# Patient Record
Sex: Male | Born: 1955 | Race: White | Hispanic: No | Marital: Single | Smoking: Never smoker
Health system: Southern US, Community
[De-identification: ages and names within clinical notes are randomized; demographics above are authoritative.]

## PROBLEM LIST (undated history)

## (undated) DIAGNOSIS — N189 Chronic kidney disease, unspecified: Secondary | ICD-10-CM

## (undated) DIAGNOSIS — T7840XA Allergy, unspecified, initial encounter: Secondary | ICD-10-CM

## (undated) DIAGNOSIS — F1910 Other psychoactive substance abuse, uncomplicated: Secondary | ICD-10-CM

## (undated) HISTORY — PX: KIDNEY STONE SURGERY: SHX686

## (undated) HISTORY — DX: Chronic kidney disease, unspecified: N18.9

## (undated) HISTORY — DX: Other psychoactive substance abuse, uncomplicated: F19.10

## (undated) HISTORY — DX: Allergy, unspecified, initial encounter: T78.40XA

---

## 2013-01-28 ENCOUNTER — Ambulatory Visit: Payer: Self-pay | Admitting: Family Medicine

## 2013-01-28 VITALS — BP 132/86 | HR 61 | Temp 98.5°F | Resp 18 | Ht 69.0 in | Wt 213.4 lb

## 2013-01-28 DIAGNOSIS — M109 Gout, unspecified: Secondary | ICD-10-CM

## 2013-01-28 DIAGNOSIS — E669 Obesity, unspecified: Secondary | ICD-10-CM

## 2013-01-28 DIAGNOSIS — Z79899 Other long term (current) drug therapy: Secondary | ICD-10-CM

## 2013-01-28 LAB — LIPID PANEL
Cholesterol: 196 mg/dL (ref 0–200)
HDL: 34 mg/dL — ABNORMAL LOW (ref >39)
Total CHOL/HDL Ratio: 5.8 Ratio
Triglycerides: 225 mg/dL — ABNORMAL HIGH (ref <150)

## 2013-01-28 LAB — PSA: PSA: 1.77 ng/mL (ref <=4.00)

## 2013-01-28 MED ORDER — ALLOPURINOL 100 MG PO TABS: 300.0000 mg | ORAL_TABLET | Freq: Every day | ORAL | Status: DC

## 2013-01-28 MED ORDER — VENLAFAXINE HCL ER 75 MG PO CP24: 75.0000 mg | ORAL_CAPSULE | Freq: Every day | ORAL | Status: DC

## 2013-01-28 NOTE — Patient Instructions (Signed)
Health Maintenance, Males A healthy lifestyle and preventative care can promote health and wellness.  Maintain regular health, dental, and eye exams.  Eat a healthy diet. Foods like vegetables, fruits, whole grains, low-fat dairy products, and lean protein foods contain the nutrients you need without too many calories. Decrease your intake of foods high in solid fats, added sugars, and salt. Get information about a proper diet from your caregiver, if necessary.  Regular physical exercise is one of the most important things you can do for your health. Most adults should get at least 150 minutes of moderate-intensity exercise (any activity that increases your heart rate and causes you to sweat) each week. In addition, most adults need muscle-strengthening exercises on 2 or more days a week.   Maintain a healthy weight. The body mass index (BMI) is a screening tool to identify possible weight problems. It provides an estimate of body fat based on height and weight. Your caregiver can help determine your BMI, and can help you achieve or maintain a healthy weight. For adults 20 years and older:  A BMI below 18.5 is considered underweight.  A BMI of 18.5 to 24.9 is normal.  A BMI of 25 to 29.9 is considered overweight.  A BMI of 30 and above is considered obese.  Maintain normal blood lipids and cholesterol by exercising and minimizing your intake of saturated fat. Eat a balanced diet with plenty of fruits and vegetables. Blood tests for lipids and cholesterol should begin at age 20 and be repeated every 5 years. If your lipid or cholesterol levels are high, you are over 50, or you are a high risk for heart disease, you may need your cholesterol levels checked more frequently.Ongoing high lipid and cholesterol levels should be treated with medicines, if diet and exercise are not effective.  If you smoke, find out from your caregiver how to quit. If you do not use tobacco, do not start.  If you  choose to drink alcohol, do not exceed 2 drinks per day. One drink is considered to be 12 ounces (355 mL) of beer, 5 ounces (148 mL) of wine, or 1.5 ounces (44 mL) of liquor.  Avoid use of street drugs. Do not share needles with anyone. Ask for help if you need support or instructions about stopping the use of drugs.  High blood pressure causes heart disease and increases the risk of stroke. Blood pressure should be checked at least every 1 to 2 years. Ongoing high blood pressure should be treated with medicines if weight loss and exercise are not effective.  If you are 45 to 56 years old, ask your caregiver if you should take aspirin to prevent heart disease.  Diabetes screening involves taking a blood sample to check your fasting blood sugar level. This should be done once every 3 years, after age 45, if you are within normal weight and without risk factors for diabetes. Testing should be considered at a younger age or be carried out more frequently if you are overweight and have at least 1 risk factor for diabetes.  Colorectal cancer can be detected and often prevented. Most routine colorectal cancer screening begins at the age of 50 and continues through age 75. However, your caregiver may recommend screening at an earlier age if you have risk factors for colon cancer. On a yearly basis, your caregiver may provide home test kits to check for hidden blood in the stool. Use of a small camera at the end of a tube,   to directly examine the colon (sigmoidoscopy or colonoscopy), can detect the earliest forms of colorectal cancer. Talk to your caregiver about this at age 50, when routine screening begins. Direct examination of the colon should be repeated every 5 to 10 years through age 75, unless early forms of pre-cancerous polyps or small growths are found.  Hepatitis C blood testing is recommended for all people born from 1945 through 1965 and any individual with known risks for hepatitis C.  Healthy  men should no longer receive prostate-specific antigen (PSA) blood tests as part of routine cancer screening. Consult with your caregiver about prostate cancer screening.  Testicular cancer screening is not recommended for adolescents or adult males who have no symptoms. Screening includes self-exam, caregiver exam, and other screening tests. Consult with your caregiver about any symptoms you have or any concerns you have about testicular cancer.  Practice safe sex. Use condoms and avoid high-risk sexual practices to reduce the spread of sexually transmitted infections (STIs).  Use sunscreen with a sun protection factor (SPF) of 30 or greater. Apply sunscreen liberally and repeatedly throughout the day. You should seek shade when your shadow is shorter than you. Protect yourself by wearing long sleeves, pants, a wide-brimmed hat, and sunglasses year round, whenever you are outdoors.  Notify your caregiver of new moles or changes in moles, especially if there is a change in shape or color. Also notify your caregiver if a mole is larger than the size of a pencil eraser.  A one-time screening for abdominal aortic aneurysm (AAA) and surgical repair of large AAAs by sound wave imaging (ultrasonography) is recommended for ages 65 to 75 years who are current or former smokers.  Stay current with your immunizations. Document Released: 05/02/2008 Document Revised: 01/27/2012 Document Reviewed: 04/01/2011 Exit[employer] Patient Information 2013 [employer].  

## 2013-01-28 NOTE — Progress Notes (Signed)
  Subjective:    Patient ID: Richard Dawson, male    DOB: 1956-06-06, 57 y.o.   MRN: [id number] Chief Complaint  Patient presents with  . Medication Refill    establish new dr   HPI On Effexor and allopurinol 100mg  once a day.  To prevent kidney stones and gout so always borderlines uric acid level since 57 yo and around 57 yo had sev kidney stones but no sig ones now since 1995.  Had severe gout flaire in L elbow - still with limited ROM.  Had gout flair qyr to q81mos.  [facility] in [place], [place] - last check up was at 57 yo.  Did have a colonoscopy at 57 yo - nml so recheck in 10 yrs.   Is fasting.  Did have a little cream in the coffee.  Occ aches and pains - occ back pains.  Past Medical History  Diagnosis Date  . Allergy   . Chronic kidney disease     kidney stones  . Substance abuse   Gout  Cousin who is 11 has an elarged prostate and heart attack.  Has lost 8-10 lbs in the past few mo through diet and exercise. Has an 56 yo -  And due to have a newborn in 6 mos  No current outpatient prescriptions on file prior to visit.   No current facility-administered medications on file prior to visit.   No Known Allergies  Review of Systems    BP 132/86  Pulse 61  Temp(Src) 98.5 F (36.9 C) (Oral)  Resp 18  Ht 5\' 9"  (1.753 m)  Wt 213 lb 6.4 oz (96.798 kg)  BMI 31.5 kg/m2  SpO2 97% Objective:   Physical Exam        Assessment & Plan:  Gout - Plan: allopurinol (ZYLOPRIM) 100 MG tablet  Encounter for long-term (current) use of other medications  Obesity (BMI 30.0-34.9) - Plan: POCT glucose (manual entry), PSA, Lipid panel, Uric acid  Meds ordered this encounter  Medications  . DISCONTD: venlafaxine (EFFEXOR) 75 MG tablet    Sig: Take 75 mg by mouth 2 (two) times daily.  Marland Kitchen venlafaxine XR (EFFEXOR XR) 75 MG 24 hr capsule    Sig: Take 1 capsule (75 mg total) by mouth daily.    Dispense:  90 capsule    Refill:  3  . allopurinol (ZYLOPRIM) 100 MG tablet    Sig: Take  3 tablets (300 mg total) by mouth daily.    Dispense:  90 tablet    Refill:  3

## 2013-02-07 ENCOUNTER — Other Ambulatory Visit: Payer: Self-pay | Admitting: Family Medicine

## 2013-02-07 DIAGNOSIS — Z8249 Family history of ischemic heart disease and other diseases of the circulatory system: Secondary | ICD-10-CM

## 2013-02-15 ENCOUNTER — Encounter: Payer: Self-pay | Admitting: *Deleted

## 2013-02-15 ENCOUNTER — Encounter: Payer: Self-pay | Admitting: Cardiology

## 2013-02-15 DIAGNOSIS — N189 Chronic kidney disease, unspecified: Secondary | ICD-10-CM | POA: Insufficient documentation

## 2013-02-15 DIAGNOSIS — T7840XA Allergy, unspecified, initial encounter: Secondary | ICD-10-CM | POA: Insufficient documentation

## 2013-02-15 DIAGNOSIS — F1910 Other psychoactive substance abuse, uncomplicated: Secondary | ICD-10-CM | POA: Insufficient documentation

## 2013-02-16 ENCOUNTER — Institutional Professional Consult (permissible substitution): Payer: Self-pay | Admitting: Cardiology

## 2013-03-22 ENCOUNTER — Institutional Professional Consult (permissible substitution): Payer: Self-pay | Admitting: Cardiology

## 2013-04-01 ENCOUNTER — Ambulatory Visit: Payer: Self-pay

## 2013-04-01 ENCOUNTER — Ambulatory Visit: Payer: Self-pay | Admitting: Emergency Medicine

## 2013-04-01 VITALS — BP 124/78 | HR 82 | Temp 98.0°F | Resp 17 | Ht 70.0 in | Wt 212.0 lb

## 2013-04-01 DIAGNOSIS — M545 Low back pain: Secondary | ICD-10-CM

## 2013-04-01 DIAGNOSIS — M5137 Other intervertebral disc degeneration, lumbosacral region: Secondary | ICD-10-CM

## 2013-04-01 MED ORDER — HYDROCODONE-ACETAMINOPHEN 5-325 MG PO TABS: 1.0000 | ORAL_TABLET | Freq: Four times a day (QID) | ORAL | Status: DC | PRN

## 2013-04-01 MED ORDER — MELOXICAM 15 MG PO TABS: 15.0000 mg | ORAL_TABLET | Freq: Every day | ORAL | Status: DC

## 2013-04-01 NOTE — Progress Notes (Signed)
  Subjective:    Patient ID: Richard Dawson, male    DOB: [birth year], 57 y.o.   MRN: [id number]  HPI Patient complains of pain with his low back from a fall skateboarding 2 months ago. Worse yesterday when he stepped off ladder after cleaning gutters, stepped backwards, was a couple rungs up the ladder, not on bottom rung. Has been treated for this in [place]. Was treated with medications, but states does not recall what he was given, states medication given was not helpful. A neighbor gave him some meds that were helpful. Has 43 week old newborn baby, and has son who is 18 years old. 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  Has pain with flexion of low back, pain with holding the baby. Has seen Dr Clelia Croft for his physical, has stress test scheduled for July 9th. He is self employed. When asked about substance abuse history, denies drug use in past 10 years. Denies any trouble with addiction to opiates. His past history was alcohol and cocaine.   Patient denies any radicular pain, denies any problems with his bowel and bladder function since this injury.  Review of Systems     Objective:   Physical Exam Healthy appearing 57 y.o male with tenderness over low back. Negative straight leg raise bilateral. Deep tendon reflexes are 2+. There is no definite motor weakness. He has limited extension flexion but normal lateral posting.  [facility] reading (PRIMARY) by  Dr. Cleta Alberts there is a disc at L4-L5 with questionable vacuum phenomenon please comment       Assessment & Plan:  Patient will be on Mobic 15 mg one every morning. He was given hydrocodone 5 mg as needed. He is referred of the airway is for consideration for decompression therapy for disc disease

## 2013-04-01 NOTE — Patient Instructions (Signed)
Back Pain, Adult Low back pain is very common. About 1 in 5 people have back pain.The cause of low back pain is rarely dangerous. The pain often gets better over time.About half of people with a sudden onset of back pain feel better in just 2 weeks. About 8 in 10 people feel better by 6 weeks.  CAUSES Some common causes of back pain include:  Strain of the muscles or ligaments supporting the spine.  Wear and tear (degeneration) of the spinal discs.  Arthritis.  Direct injury to the back. DIAGNOSIS Most of the time, the direct cause of low back pain is not known.However, back pain can be treated effectively even when the exact cause of the pain is unknown.Answering your caregiver's questions about your overall health and symptoms is one of the most accurate ways to make sure the cause of your pain is not dangerous. If your caregiver needs more information, he or she may order lab work or imaging tests (X-rays or MRIs).However, even if imaging tests show changes in your back, this usually does not require surgery. HOME CARE INSTRUCTIONS For many people, back pain returns.Since low back pain is rarely dangerous, it is often a condition that people can learn to manageon their own.   Remain active. It is stressful on the back to sit or stand in one place. Do not sit, drive, or stand in one place for more than 30 minutes at a time. Take short walks on level surfaces as soon as pain allows.Try to increase the length of time you walk each day.  Do not stay in bed.Resting more than 1 or 2 days can delay your recovery.  Do not avoid exercise or work.Your body is made to move.It is not dangerous to be active, even though your back may hurt.Your back will likely heal faster if you return to being active before your pain is gone.  Pay attention to your body when you bend and lift. Many people have less discomfortwhen lifting if they bend their knees, keep the load close to their bodies,and  avoid twisting. Often, the most comfortable positions are those that put less stress on your recovering back.  Find a comfortable position to sleep. Use a firm mattress and lie on your side with your knees slightly bent. If you lie on your back, put a pillow under your knees.  Only take over-the-counter or prescription medicines as directed by your caregiver. Over-the-counter medicines to reduce pain and inflammation are often the most helpful.Your caregiver may prescribe muscle relaxant drugs.These medicines help dull your pain so you can more quickly return to your normal activities and healthy exercise.  Put ice on the injured area.  Put ice in a plastic bag.  Place a towel between your skin and the bag.  Leave the ice on for 15 to 20 minutes, 3 to 4 times a day for the first 2 to 3 days. After that, ice and heat may be alternated to reduce pain and spasms.  Ask your caregiver about trying back exercises and gentle massage. This may be of some benefit.  Avoid feeling anxious or stressed.Stress increases muscle tension and can worsen back pain.It is important to recognize when you are anxious or stressed and learn ways to manage it.Exercise is a great option. SEEK MEDICAL CARE IF:  You have pain that is not relieved with rest or medicine.  You have pain that does not improve in 1 week.  You have new symptoms.  You are generally   not feeling well. SEEK IMMEDIATE MEDICAL CARE IF:   You have pain that radiates from your back into your legs.  You develop new bowel or bladder control problems.  You have unusual weakness or numbness in your arms or legs.  You develop nausea or vomiting.  You develop abdominal pain.  You feel faint. Document Released: 11/04/2005 Document Revised: 05/05/2012 Document Reviewed: 03/25/2011 Exit[employer] Patient Information 2013 [employer].  

## 2013-05-24 ENCOUNTER — Institutional Professional Consult (permissible substitution): Payer: Self-pay | Admitting: Cardiology

## 2013-06-28 ENCOUNTER — Encounter: Payer: Self-pay | Admitting: Cardiology

## 2013-08-24 ENCOUNTER — Other Ambulatory Visit: Payer: Self-pay | Admitting: Family Medicine

## 2013-09-23 ENCOUNTER — Other Ambulatory Visit: Payer: Self-pay

## 2013-10-05 ENCOUNTER — Other Ambulatory Visit: Payer: Self-pay | Admitting: Family Medicine

## 2013-11-04 ENCOUNTER — Other Ambulatory Visit: Payer: Self-pay | Admitting: Physician Assistant

## 2013-12-10 ENCOUNTER — Ambulatory Visit: Payer: Self-pay | Admitting: Emergency Medicine

## 2013-12-10 ENCOUNTER — Ambulatory Visit: Payer: Self-pay

## 2013-12-10 ENCOUNTER — Telehealth: Payer: Self-pay | Admitting: Emergency Medicine

## 2013-12-10 VITALS — BP 124/80 | HR 68 | Temp 97.7°F | Resp 18 | Ht 70.0 in | Wt 222.0 lb

## 2013-12-10 DIAGNOSIS — F32A Depression, unspecified: Secondary | ICD-10-CM

## 2013-12-10 DIAGNOSIS — E785 Hyperlipidemia, unspecified: Secondary | ICD-10-CM

## 2013-12-10 DIAGNOSIS — M109 Gout, unspecified: Secondary | ICD-10-CM

## 2013-12-10 DIAGNOSIS — M5137 Other intervertebral disc degeneration, lumbosacral region: Secondary | ICD-10-CM

## 2013-12-10 DIAGNOSIS — F3289 Other specified depressive episodes: Secondary | ICD-10-CM

## 2013-12-10 DIAGNOSIS — F329 Major depressive disorder, single episode, unspecified: Secondary | ICD-10-CM

## 2013-12-10 LAB — COMPREHENSIVE METABOLIC PANEL
ALK PHOS: 51 U/L (ref 39–117)
ALT: 21 U/L (ref 0–53)
AST: 20 U/L (ref 0–37)
Albumin: 4.5 g/dL (ref 3.5–5.2)
BILIRUBIN TOTAL: 0.4 mg/dL (ref 0.3–1.2)
BUN: 15 mg/dL (ref 6–23)
CO2: 24 mEq/L (ref 19–32)
CREATININE: 0.76 mg/dL (ref 0.50–1.35)
Calcium: 9.6 mg/dL (ref 8.4–10.5)
Chloride: 104 mEq/L (ref 96–112)
GLUCOSE: 91 mg/dL (ref 70–99)
Potassium: 4.7 mEq/L (ref 3.5–5.3)
SODIUM: 138 meq/L (ref 135–145)
TOTAL PROTEIN: 7.5 g/dL (ref 6.0–8.3)

## 2013-12-10 LAB — LIPID PANEL
CHOL/HDL RATIO: 5.6 ratio
Cholesterol: 196 mg/dL (ref 0–200)
HDL: 35 mg/dL — ABNORMAL LOW (ref >39)
LDL CALC: 98 mg/dL (ref 0–99)
TRIGLYCERIDES: 316 mg/dL — AB (ref <150)
VLDL: 63 mg/dL — AB (ref 0–40)

## 2013-12-10 LAB — POCT CBC
Granulocyte percent: 50.7 %G (ref 37–80)
HCT, POC: 47.8 % (ref 43.5–53.7)
Hemoglobin: 14.9 g/dL (ref 14.1–18.1)
LYMPH, POC: 2.7 (ref 0.6–3.4)
MCH: 26.4 pg — AB (ref 27–31.2)
MCHC: 31.2 g/dL — AB (ref 31.8–35.4)
MCV: 84.8 fL (ref 80–97)
MID (CBC): 0.6 (ref 0–0.9)
MPV: 8.4 fL (ref 0–99.8)
POC GRANULOCYTE: 3.3 (ref 2–6.9)
POC LYMPH %: 40.3 % (ref 10–50)
POC MID %: 9 % (ref 0–12)
Platelet Count, POC: 318 10*3/uL (ref 142–424)
RBC: 5.64 M/uL (ref 4.69–6.13)
RDW, POC: 14.1 %
WBC: 6.6 10*3/uL (ref 4.6–10.2)

## 2013-12-10 LAB — URIC ACID: Uric Acid, Serum: 7.8 mg/dL (ref 4.0–7.8)

## 2013-12-10 MED ORDER — MELOXICAM 15 MG PO TABS: 15.0000 mg | ORAL_TABLET | Freq: Every day | ORAL | Status: DC

## 2013-12-10 MED ORDER — VENLAFAXINE HCL ER 75 MG PO CP24: ORAL_CAPSULE | ORAL | Status: DC

## 2013-12-10 MED ORDER — ALLOPURINOL 300 MG PO TABS: ORAL_TABLET | ORAL | Status: DC

## 2013-12-10 NOTE — Telephone Encounter (Signed)
Call patient and let him know the soft tissue density is nonspecific. I would advise a repeat x-ray in 3-6 months to be sure the lesion is not increasing in size

## 2013-12-10 NOTE — Progress Notes (Addendum)
Subjective:   Patient ID: Richard Dawson, male    DOB: Jul 24, 1956, 58 y.o.   MRN: [id number]  This chart was scribed for Lesle Chris, MD by Ardelia Mems, Scribe. This patient was seen in room 8 and the patient's care was started at 11:22 AM.  Chief Complaint  Patient presents with  . rx refills    effexor but would like to chang to wellburtin  . Tendonitis  . thumb pian  . Knee Pain    left mostly   . right foot pain  . itchy eyes    x1 mth    HPI  HPI Comments: Richard Dawson is a 58 y.o. male who presents to [facility] requesting medicaton refills. He states that he was last seen here about a year ago. He states that he has been out of his prescribed allopurinol (100 mg, 3x/day) for the past 3 weeks. He also states that he is out of his prescribed 75 mg Effexor, but that he would like to consider either a dosage increase or a change to Wellbutrin instead. He reports that he takes Effexor to "slow himself down". He also states that he has been having intermittent, aching right elbow pain for over a month. He states that this elbow pain is only present with palpation and exertion and not while at rest. He suspects from online research that his elbow pain could be tendonitis, and he does not believe this is due to his history of gout. He also reports he has been having right thumb pain that is worsened with gripping objects. He states that he has not had this season's flu vaccine, and that he has never had a flu vaccine, and he is unsure if he wants this today.   Review of Systems  Musculoskeletal: Arthralgias: right elbow.       Right thumb pain      BP 124/80  Pulse 68  Temp(Src) 97.7 F (36.5 C) (Oral)  Resp 18  Ht 5\' 10"  (1.778 m)  Wt 222 lb (100.699 kg)  BMI 31.85 kg/m2  SpO2 96% Objective:   Physical Exam CONSTITUTIONAL: Well developed/well nourished HEAD: Normocephalic/atraumatic EYES: EOMI/PERRL ENMT: Mucous membranes moist NECK: supple no meningeal  signs SPINE:entire spine nontender CV: S1/S2 noted, no murmurs/rubs/gallops noted LUNGS: Lungs are clear to auscultation bilaterally, no apparent distress ABDOMEN: soft, nontender, no rebound or guarding GU:no cva tenderness NEURO: Pt is awake/alert, moves all extremitiesx4 EXTREMITIES: pulses normal, full ROM SKIN: warm, color normal PSYCH: no abnormalities of mood noted [facility] reading (PRIMARY) by  Dr. Cleta Alberts examination of the thumb reveals arthritic changes at the base of the first metacarpal. Examination of the elbow reveals minimal bony changes there is a half centimeter by half centimeter soft tissue density seen to Meds ordered this encounter  Medications  . venlafaxine XR (EFFEXOR XR) 75 MG 24 hr capsule    Sig: Take 2 tablets daily for a total dose of the 150 mg a day    Dispense:  60 capsule    Refill:  5  . allopurinol (ZYLOPRIM) 300 MG tablet    Sig: Take one tablet daily    Dispense:  30 tablet    Refill:  11  . meloxicam (MOBIC) 15 MG tablet    Sig: Take 1 tablet (15 mg total) by mouth daily.    Dispense:  30 tablet    Refill:  1        Assessment & Plan:  Will increase the  dose of his Effexor continue his allopurinol 300 mg one a day labs were checked today. I also refilled his Mobic today we'll see what the radiologist says about soft tissue density on his elbow film.  I personally performed the services described in this documentation, which was scribed in my presence. The recorded information has been reviewed and is accurate.

## 2013-12-13 NOTE — Telephone Encounter (Signed)
Spoke with pt and informed him of results. He will do follow up xray in 3-6 months.

## 2014-02-16 ENCOUNTER — Other Ambulatory Visit: Payer: Self-pay | Admitting: Emergency Medicine

## 2014-03-30 ENCOUNTER — Other Ambulatory Visit: Payer: Self-pay

## 2014-03-30 MED ORDER — VENLAFAXINE HCL ER 150 MG PO CP24: 150.0000 mg | ORAL_CAPSULE | Freq: Every day | ORAL | Status: DC

## 2014-06-06 ENCOUNTER — Other Ambulatory Visit: Payer: Self-pay | Admitting: Emergency Medicine

## 2014-07-22 ENCOUNTER — Other Ambulatory Visit: Payer: Self-pay | Admitting: Physician Assistant

## 2015-04-05 ENCOUNTER — Ambulatory Visit (INDEPENDENT_AMBULATORY_CARE_PROVIDER_SITE_OTHER): Payer: Self-pay | Admitting: Physician Assistant

## 2015-04-05 VITALS — BP 144/80 | HR 83 | Temp 98.1°F | Resp 18 | Ht 70.25 in | Wt 218.0 lb

## 2015-04-05 DIAGNOSIS — F411 Generalized anxiety disorder: Secondary | ICD-10-CM

## 2015-04-05 DIAGNOSIS — M778 Other enthesopathies, not elsewhere classified: Secondary | ICD-10-CM

## 2015-04-05 MED ORDER — DICLOFENAC SODIUM 1 % TD GEL: 2.0000 g | Freq: Four times a day (QID) | TRANSDERMAL | Status: DC

## 2015-04-05 MED ORDER — DESVENLAFAXINE ER 50 MG PO TB24: 50.0000 mg | ORAL_TABLET | Freq: Every day | ORAL | Status: DC

## 2015-04-05 NOTE — Progress Notes (Signed)
   Subjective:    Patient ID: Richard Dawson, male    DOB: 02/14/1956, 59 y.o.   MRN: [id number]  HPI Patient presents to discuss changing Wellbutrin and Mobic.   Was recently changed from Venlafaxine 150 mg to Wellbutrin 3 weeks ago to treat his anxiety and has had unfavorable side effects of HA, nausea, and feeling terrible. In addition he is does not feel like medication takes the edge/impulsiveness/anxiety away. Feels "ramped up" most days, but feels like his day to day activities illicit these feelings. For example, has an 58 year old and 58 year old son, that he has raised his voice at more often since switching medication. Was taking Venlafaxine for 2 years, but feels he got used to medication. Has also tried Zoloft and Lexapro in the past which both worked for a time. Denies sleep disturbance, SI/HI, or depressed mood.   Has been taking Mobic for several years for should, elbow, and wrist pain. 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 He is also concerned about long term use of Mobic.   NKDA.   Review of Systems  Constitutional: Negative.   Gastrointestinal: Positive for nausea (not currently).  Neurological: Positive for headaches (not currently). Negative for dizziness, weakness, light-headedness and numbness.  Psychiatric/Behavioral: Positive for agitation. Negative for suicidal ideas, behavioral problems, sleep disturbance, self-injury, dysphoric mood and decreased concentration. The patient is nervous/anxious. The patient is not hyperactive.        Objective:   Physical Exam  Constitutional: He is oriented to person, place, and time. He appears well-developed and well-nourished. No distress.  Blood pressure 144/80, pulse 83, temperature 98.1 F (36.7 C), temperature source Oral, resp. rate 18, height 5' 10.25" (1.784 m), weight 218 lb (98.884 kg), SpO2 97 %.   HENT:  Head: Normocephalic and atraumatic.  Right Ear:  External ear normal.  Left Ear: External ear normal.  Eyes: Conjunctivae are normal. Right eye exhibits no discharge. Left eye exhibits no discharge.  Pulmonary/Chest: Effort normal.  Neurological: He is alert and oriented to person, place, and time.  Skin: He is not diaphoretic.  Psychiatric: He has a normal mood and affect. His speech is normal and behavior is normal. Judgment and thought content normal. Cognition and memory are normal.      Assessment & Plan:  1. Anxiety state RTC in 4-6 weeks for re-evaluation. Med side effects discussed.  - Desvenlafaxine ER (PRISTIQ) 50 MG TB24; Take 1 tablet (50 mg total) by mouth daily.  Dispense: 30 tablet; Refill: 1  2. Tendonitis of wrist, right - diclofenac sodium (VOLTAREN) 1 % GEL; Apply 2 g topically 4 (four) times daily.  Dispense: 1 Tube; Refill: 1   Seerat Peaden PA-C  [facility] 04/05/2015 1:42 PM

## 2015-04-06 NOTE — Progress Notes (Signed)
  Medical screening examination/treatment/procedure(s) were performed by non-physician practitioner and as supervising physician I was immediately available for consultation/collaboration.     

## 2015-06-16 IMAGING — CR DG FINGER THUMB 2+V*R*
1 series · 1 of 1 positions shown · non-contrast
Comparison: None.

CLINICAL DATA: Gout

EXAM:
RIGHT THUMB 2+V

[PA]
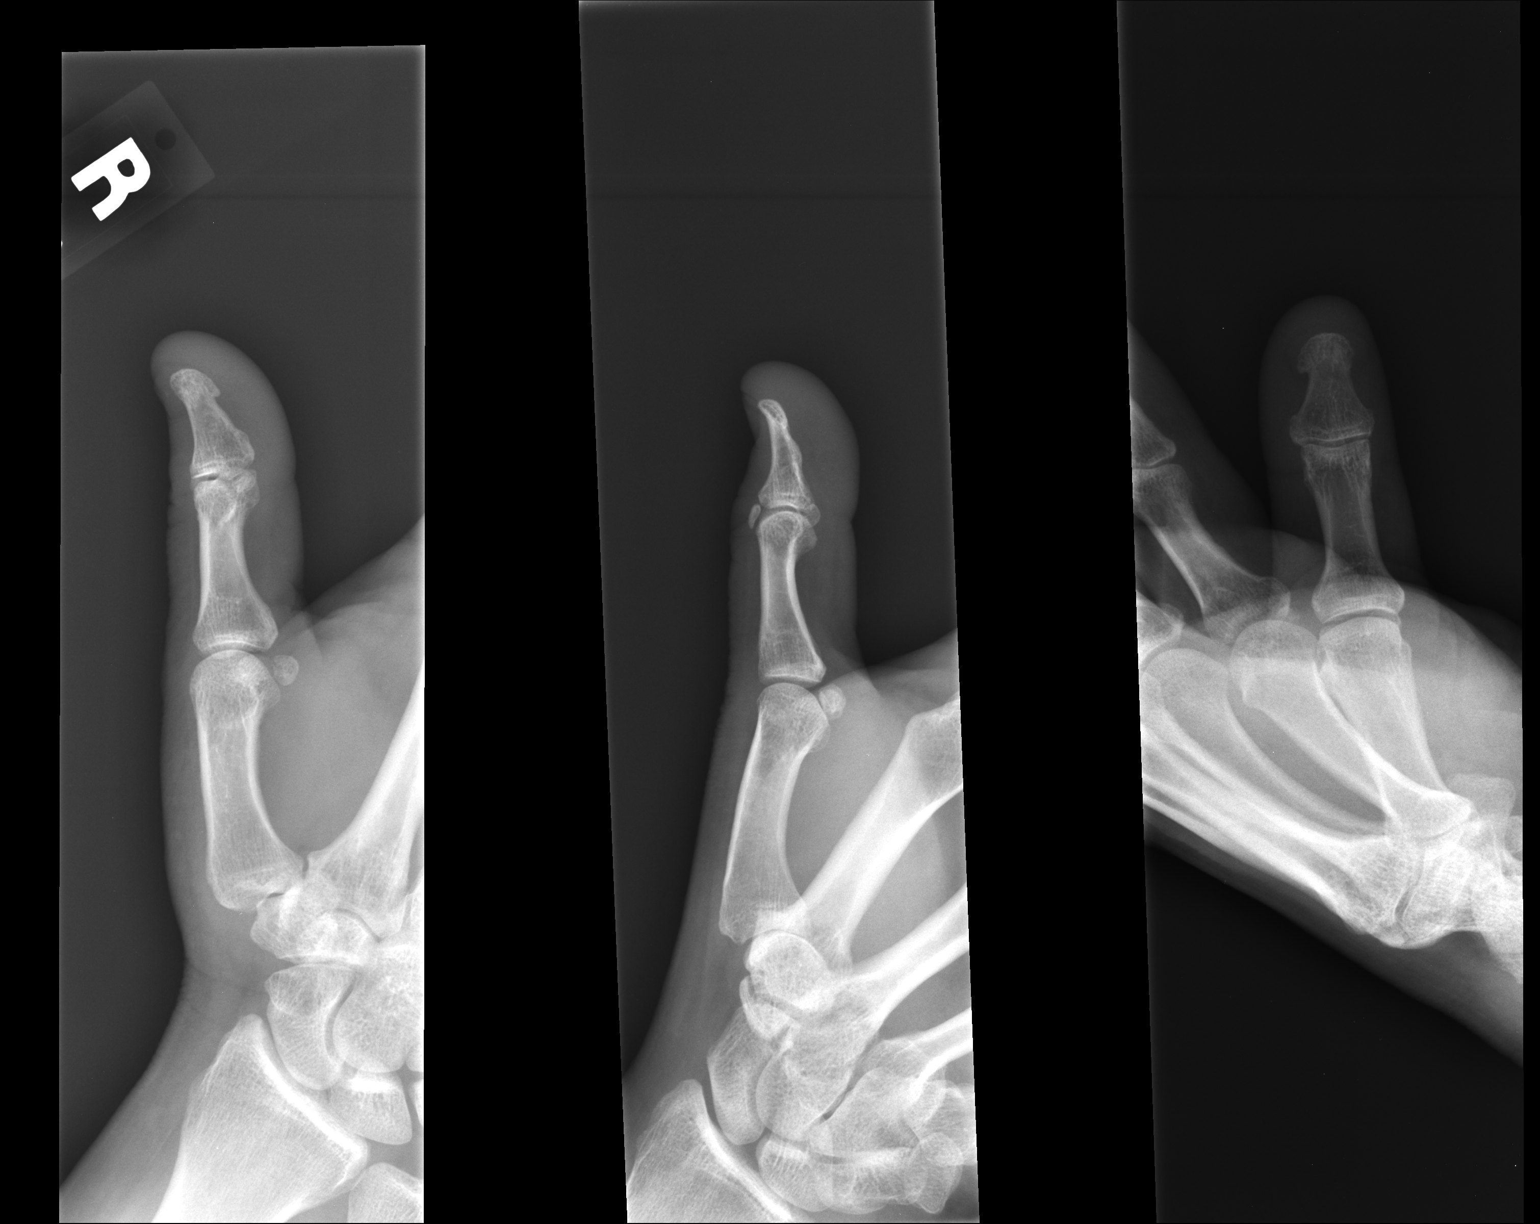

[1 of 1 positions shown; findings below may reference images not displayed]

FINDINGS: Three views of right thumb submitted. No acute fracture or
subluxation. Mild degenerative changes first carpometacarpal joint.
IMPRESSION: No acute fracture or subluxation. Mild degenerative changes first
carpometacarpal joint.

## 2015-06-16 IMAGING — CR DG ELBOW 2V*R*
2 series · 2 of 2 positions shown · non-contrast
Comparison: None.

CLINICAL DATA: Right elbow pain.

EXAM:
RIGHT ELBOW - 2 VIEW

[AP]
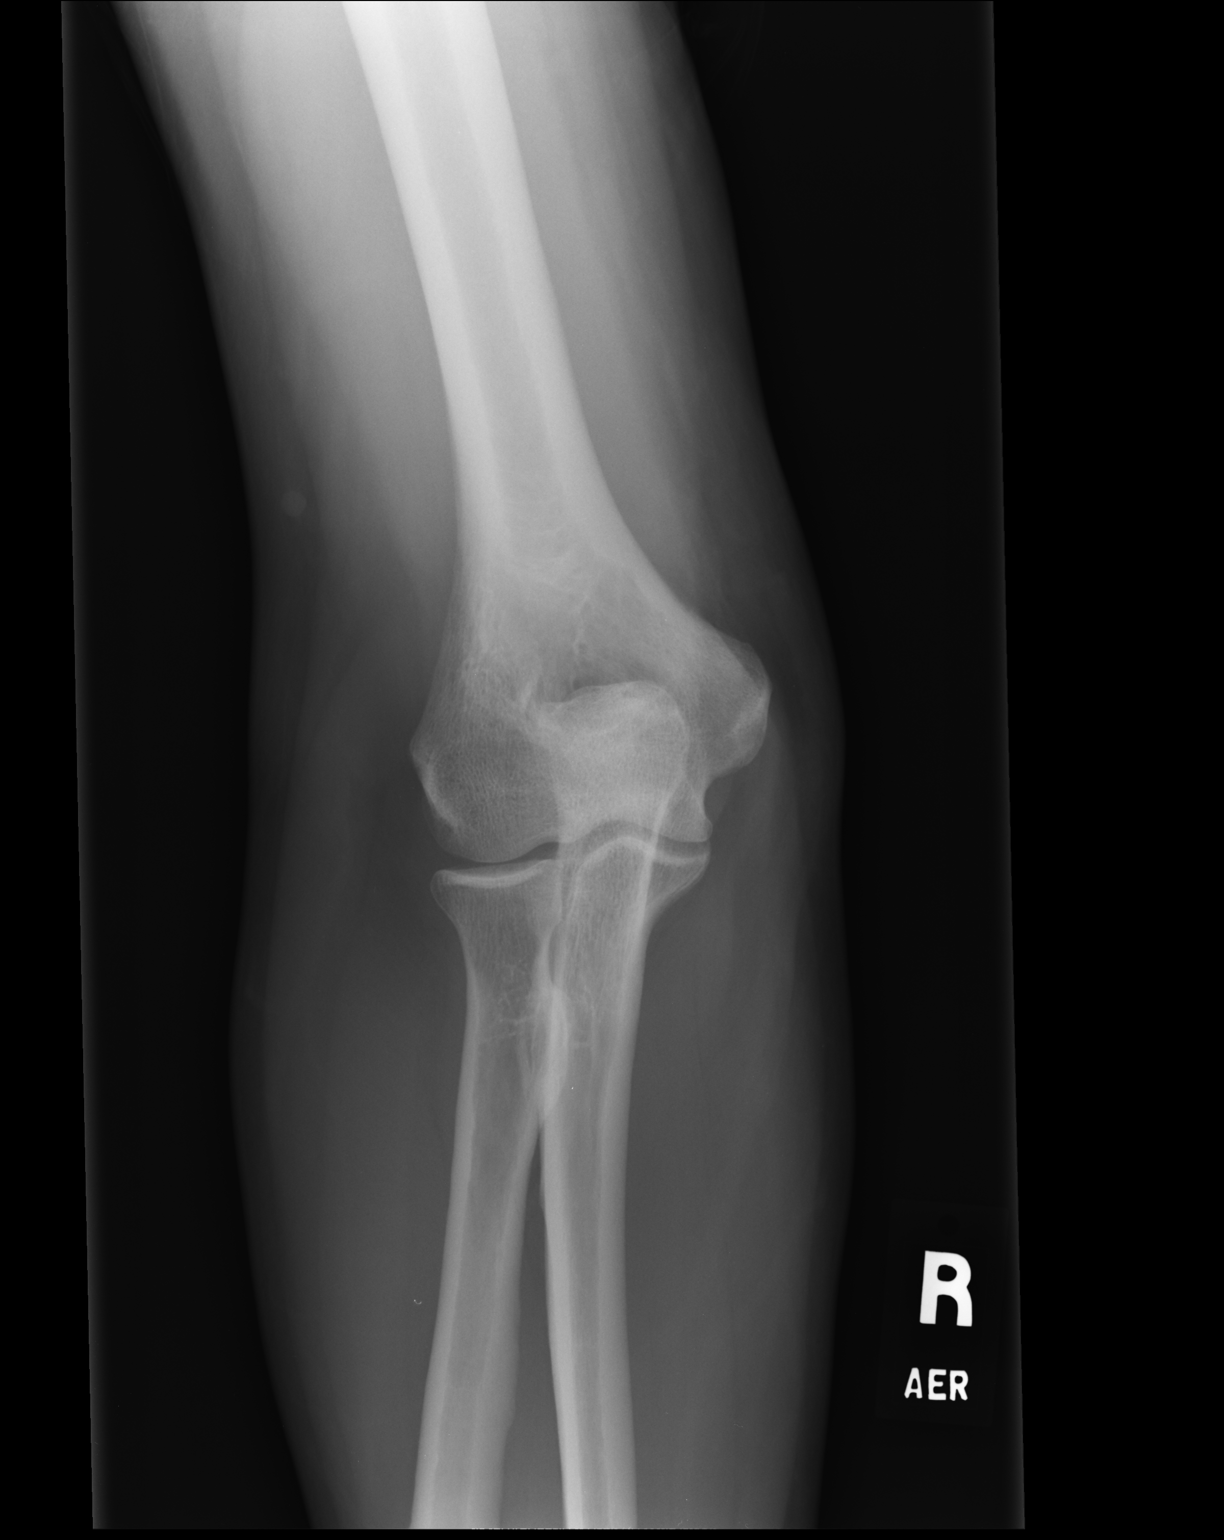

[lateral]
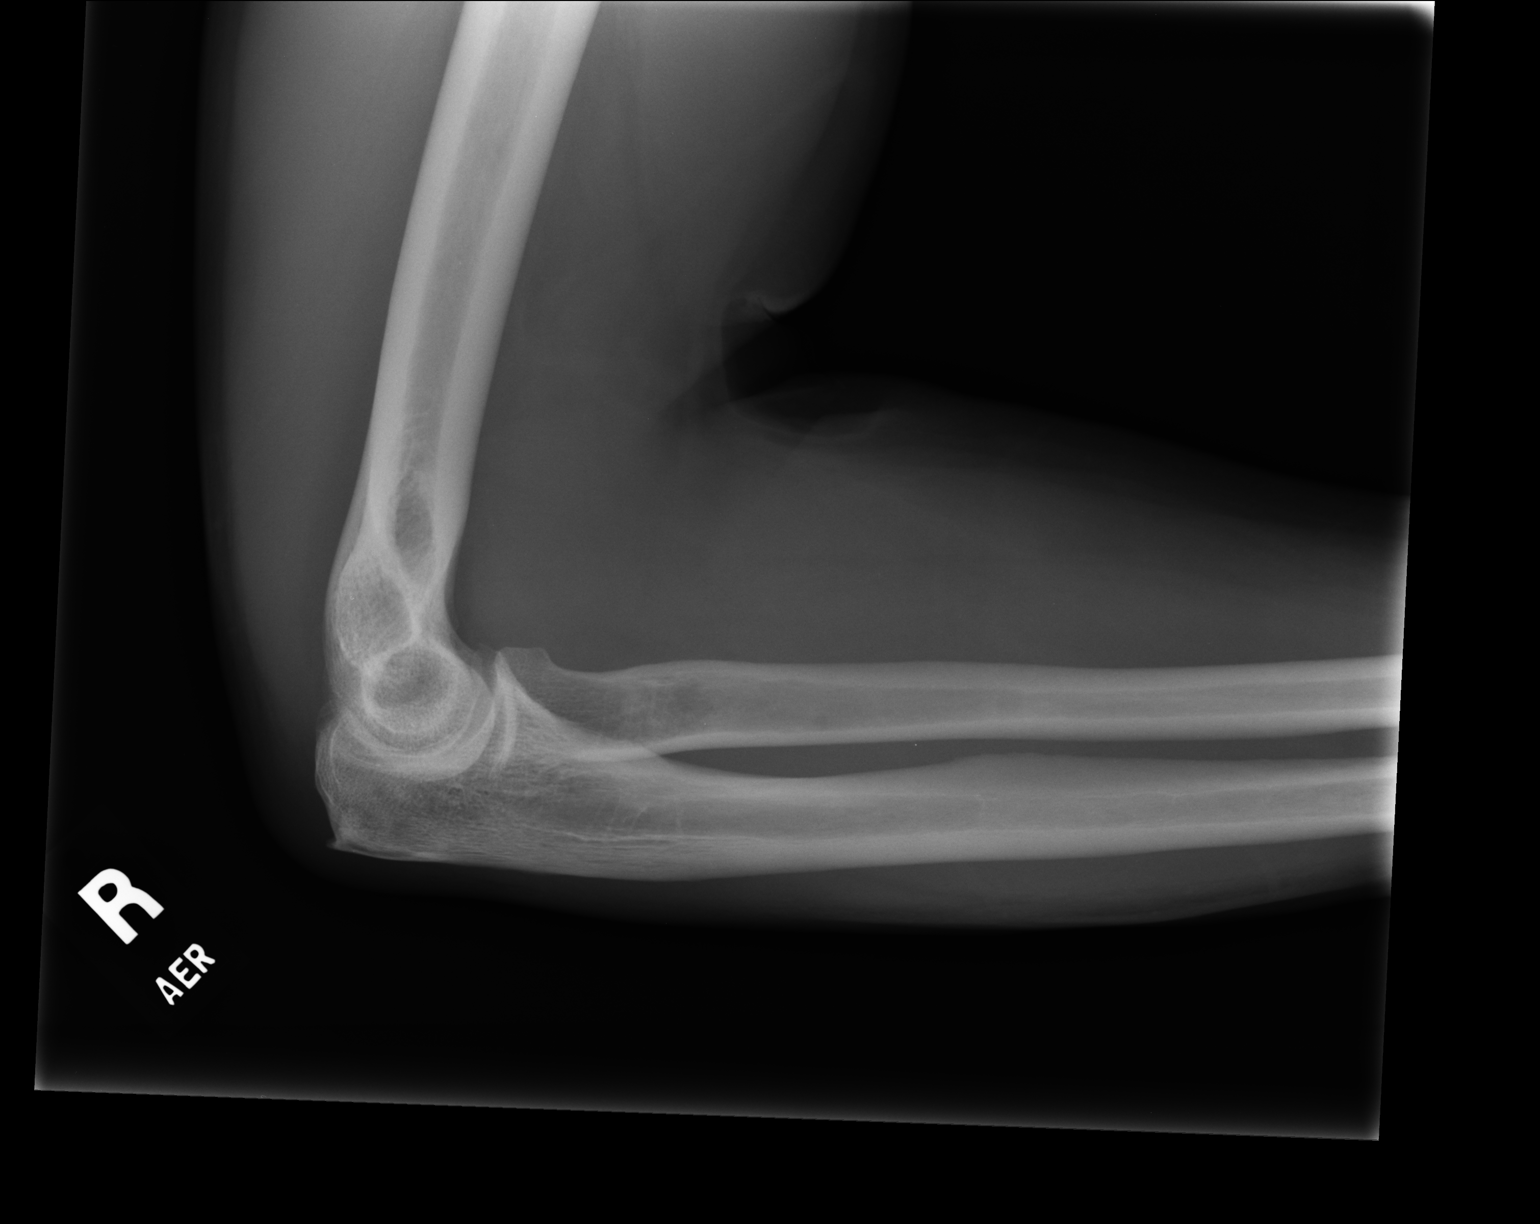

[2 of 2 positions shown; findings below may reference images not displayed]

FINDINGS: Two views of the right elbow were obtained. The elbow is located
without fracture. No evidence for a joint effusion. There is a round
5 mm density in the region of the distal upper arm soft tissues.
This finding is nonspecific.
IMPRESSION: No acute bone abnormality.

## 2015-06-25 ENCOUNTER — Ambulatory Visit (INDEPENDENT_AMBULATORY_CARE_PROVIDER_SITE_OTHER): Payer: Self-pay | Admitting: Physician Assistant

## 2015-06-25 ENCOUNTER — Other Ambulatory Visit: Payer: Self-pay | Admitting: Physician Assistant

## 2015-06-25 VITALS — BP 128/76 | HR 72 | Temp 98.2°F | Resp 18 | Ht 69.5 in | Wt 220.8 lb

## 2015-06-25 DIAGNOSIS — Z8659 Personal history of other mental and behavioral disorders: Secondary | ICD-10-CM

## 2015-06-25 MED ORDER — CITALOPRAM HYDROBROMIDE 40 MG PO TABS: 40.0000 mg | ORAL_TABLET | Freq: Every day | ORAL | Status: DC

## 2015-06-25 NOTE — Progress Notes (Signed)
   06/25/2015 at 4:36 PM  Richard Dawson / DOB: [birth year] / MRN: [id number]  The patient has Allergy; Chronic kidney disease; and Substance abuse on his problem list.  SUBJECTIVE  Chief complaint: Medication Problem   Patient here to go back to a medication that has worked well for him in the past.  Reports he has been taking Wellbutrin for 2 months now and is easy to anger and is suffering from anxiety.  He can not remember why he went of the Celexa, however he reports his anxiousness was at its lowest point while taking this.  His girlfriend also has been pushing him to go back to this medication as well.  His last dose of Wellbutrin was about 6 weeks ago.    He  has a past medical history of Allergy; Chronic kidney disease; and Substance abuse.    Medications reviewed and updated by myself where necessary, and exist elsewhere in the encounter.   Richard Dawson has No Known Allergies. He  reports that he has never smoked. He has never used smokeless tobacco. He reports that he does not drink alcohol or use illicit drugs. He  reports that he currently engages in sexual activity. He reports using the following method of birth control/protection: None. The patient  has past surgical history that includes Kidney stone surgery.  His family history includes Cancer in his father; Hypertension in his mother.  Review of Systems  Constitutional: Negative for fever and chills.  Skin: Negative for itching and rash.  Neurological: Negative for headaches.    OBJECTIVE  His  height is 5' 9.5" (1.765 m) and weight is 220 lb 12.8 oz (100.154 kg). His oral temperature is 98.2 F (36.8 C). His blood pressure is 128/76 and his pulse is 72. His respiration is 18 and oxygen saturation is 97%.  The patient's body mass index is 32.15 kg/(m^2).  Physical Exam  Vitals reviewed. Constitutional: He is oriented to person, place, and time.  Cardiovascular: Normal rate.   Respiratory: Effort normal.  Musculoskeletal:  Normal range of motion.  Neurological: He is alert and oriented to person, place, and time.  Skin: Skin is warm and dry.    No results found for this or any previous visit (from the past 24 hour(s)).  ASSESSMENT & PLAN  Richard Dawson was seen today for medication problem.  Diagnoses and all orders for this visit:  History of anxiety disorder: Patient prescribed Celexa at 40 mg in the past with good results.  Advised he titrate the dose up over two weeks.  He is to RTC with any difficulty. Advised that we see him back in 3 months if his medication is not working to consider a different serotonergic agent.  Orders: -     citalopram (CELEXA) 40 MG tablet; Take 1 tablet (40 mg total) by mouth daily. Take 20 mg daily for the first week, and then 40 after.   The patient was advised to call or come back to clinic if he does not see an improvement in symptoms, or worsens with the above plan.   Deliah Boston, MHS, PA-C [facility] 06/25/2015 4:36 PM

## 2015-09-07 ENCOUNTER — Ambulatory Visit (INDEPENDENT_AMBULATORY_CARE_PROVIDER_SITE_OTHER): Payer: Self-pay | Admitting: Physician Assistant

## 2015-09-07 ENCOUNTER — Other Ambulatory Visit: Payer: Self-pay | Admitting: Physician Assistant

## 2015-09-07 VITALS — BP 130/70 | HR 69 | Temp 98.0°F | Resp 20 | Ht 70.0 in | Wt 215.0 lb

## 2015-09-07 DIAGNOSIS — N529 Male erectile dysfunction, unspecified: Secondary | ICD-10-CM

## 2015-09-07 DIAGNOSIS — M25562 Pain in left knee: Secondary | ICD-10-CM

## 2015-09-07 DIAGNOSIS — Z8659 Personal history of other mental and behavioral disorders: Secondary | ICD-10-CM

## 2015-09-07 DIAGNOSIS — Z8739 Personal history of other diseases of the musculoskeletal system and connective tissue: Secondary | ICD-10-CM

## 2015-09-07 DIAGNOSIS — Z8639 Personal history of other endocrine, nutritional and metabolic disease: Secondary | ICD-10-CM

## 2015-09-07 MED ORDER — SILDENAFIL CITRATE 100 MG PO TABS: 50.0000 mg | ORAL_TABLET | Freq: Every day | ORAL | Status: AC | PRN

## 2015-09-07 MED ORDER — MELOXICAM 15 MG PO TABS: 15.0000 mg | ORAL_TABLET | Freq: Every day | ORAL | Status: DC

## 2015-09-07 MED ORDER — ALLOPURINOL 300 MG PO TABS: ORAL_TABLET | ORAL | Status: DC

## 2015-09-07 MED ORDER — CITALOPRAM HYDROBROMIDE 40 MG PO TABS: 40.0000 mg | ORAL_TABLET | Freq: Every day | ORAL | Status: DC

## 2015-09-07 NOTE — Progress Notes (Signed)
09/08/2015 at 3:24 PM  Richard Dawson / DOB: 1956/05/11 / MRN: [id number]  The patient has Allergy and Substance abuse on his problem list.  SUBJECTIVE  Richard Dawson is a 59 y.o. male here for medication refills.  States that he has some left knee pain and write wrist pain that is being evaluated and managed by orthopedics in [place].  He has a follow up in place in the next month but would like refill of pain medication tonight.  Reports he was given Lortabs and he would like some more of those.  He is also given Meloxicam which has worked well for him.    Has a history of anxiety and is managed by me with Citalopram.  He does not complain of any anxious feeling since starting the Citalopram.  Has a history of gout diagnosed by Dr. Cleta Alberts in 2015 and was advised to stay on gout prophylaxis.      He denies a history of heart disease and does not take nitroglycerin.  He reports difficulty with achieving an erection and has difficulty with intercourse due to this.  Reports he has had this problem for the last three years and states that Viagra helps and denies presyncope, palpitations and diaphoresis.    He  has a past medical history of Allergy; Chronic kidney disease; and Substance abuse.    Medications reviewed and updated by myself where necessary, and exist elsewhere in the encounter.   [name] has No Known Allergies. He  reports that he has never smoked. He has never used smokeless tobacco. He reports that he does not drink alcohol or use illicit drugs. He  reports that he currently engages in sexual activity. He reports using the following method of birth control/protection: None. The patient  has past surgical history that includes Kidney stone surgery.  His family history includes Cancer in his father; Hypertension in his mother.  Review of Systems  Constitutional: Negative for fever and chills.  Respiratory: Negative for shortness of breath.   Cardiovascular: Negative for chest  pain.  Gastrointestinal: Negative for nausea and abdominal pain.  Genitourinary: Negative.   Musculoskeletal: Positive for joint pain.  Skin: Negative for rash.  Neurological: Negative for dizziness and headaches.    OBJECTIVE  His  height is  (1.778 m) and weight is 215 lb (97.523 kg). His temperature is 98 F (36.7 C). His blood pressure is 130/70 and his pulse is 69. His respiration is 20 and oxygen saturation is 97%.  The patient's body mass index is 30.85 kg/(m^2).  Physical Exam  Vitals reviewed. Constitutional: He is oriented to person, place, and time. He appears well-developed. No distress.  Eyes: EOM are normal. Pupils are equal, round, and reactive to light. No scleral icterus.  Neck: Normal range of motion.  Cardiovascular: Normal rate.   Respiratory: Effort normal.  GI: He exhibits no distension.  Musculoskeletal: Normal range of motion.  Neurological: He is alert and oriented to person, place, and time. No cranial nerve deficit.  Skin: Skin is warm and dry. No rash noted. He is not diaphoretic.  Psychiatric: He has a normal mood and affect.    No results found for this or any previous visit (from the past 24 hour(s)).  ASSESSMENT & PLAN  Amari was seen today for medication refill. Patient requesting opioids for sporadic left knee and right wrist pain.  Advised against this. Opting for Meloxicam and patient amenable.  Medications refills per patient's request.  Patient without  history of CAD and does not take Nitrates.  Will write for Viagra.    Diagnoses and all orders for this visit:  History of anxiety disorder -     citalopram (CELEXA) 40 MG tablet; Take 1 tablet (40 mg total) by mouth daily. Take 20 mg daily for the first week, and then 40 after.  Left knee pain -     meloxicam (MOBIC) 15 MG tablet; Take 1 tablet (15 mg total) by mouth daily.  History of gout -     allopurinol (ZYLOPRIM) 300 MG tablet; Take one tablet daily  Erectile dysfunction,  unspecified erectile dysfunction type -     sildenafil (VIAGRA) 100 MG tablet; Take 0.5-1 tablets (50-100 mg total) by mouth daily as needed for erectile dysfunction.   The patient was advised to call or come back to clinic if he does not see an improvement in symptoms, or worsens with the above plan.   Deliah BostonMichael Fredrika Canby, MHS, PA-C [facility] 09/08/2015 3:24 PM

## 2015-09-09 NOTE — Progress Notes (Signed)
  Medical screening examination/treatment/procedure(s) were performed by non-physician practitioner and as supervising physician I was immediately available for consultation/collaboration.     

## 2016-01-20 ENCOUNTER — Other Ambulatory Visit: Payer: Self-pay | Admitting: Physician Assistant

## 2016-04-13 ENCOUNTER — Other Ambulatory Visit: Payer: Self-pay | Admitting: Physician Assistant

## 2016-07-25 ENCOUNTER — Ambulatory Visit (INDEPENDENT_AMBULATORY_CARE_PROVIDER_SITE_OTHER): Payer: Self-pay | Admitting: Family Medicine

## 2016-07-25 VITALS — BP 134/86 | HR 75 | Temp 97.8°F | Resp 16 | Ht 69.0 in | Wt 219.0 lb

## 2016-07-25 DIAGNOSIS — M79644 Pain in right finger(s): Secondary | ICD-10-CM

## 2016-07-25 DIAGNOSIS — Z8659 Personal history of other mental and behavioral disorders: Secondary | ICD-10-CM

## 2016-07-25 DIAGNOSIS — M109 Gout, unspecified: Secondary | ICD-10-CM

## 2016-07-25 MED ORDER — HYDROCODONE-ACETAMINOPHEN 10-325 MG PO TABS: 1.0000 | ORAL_TABLET | Freq: Three times a day (TID) | ORAL | 0 refills | Status: DC | PRN

## 2016-07-25 MED ORDER — MELOXICAM 15 MG PO TABS: ORAL_TABLET | ORAL | 0 refills | Status: DC

## 2016-07-25 MED ORDER — COLCHICINE 0.6 MG PO TABS: 0.6000 mg | ORAL_TABLET | Freq: Every day | ORAL | 0 refills | Status: DC

## 2016-07-25 MED ORDER — HYDROCODONE-ACETAMINOPHEN 5-325 MG PO TABS: 1.0000 | ORAL_TABLET | Freq: Four times a day (QID) | ORAL | 0 refills | Status: DC | PRN

## 2016-07-25 MED ORDER — CITALOPRAM HYDROBROMIDE 40 MG PO TABS: 40.0000 mg | ORAL_TABLET | Freq: Every day | ORAL | 11 refills | Status: AC

## 2016-07-25 NOTE — Progress Notes (Signed)
Richard Dawson is a 60 y.o. male who presents to [facility] today for Gout attack:  1.  Gout attack:  Patient with history of recurrent gout. This most recent attack started several days ago. Has been having increasing pain in his left great toe. This is always where he has a gout attack. 2 years ago he was started on allopurinol and this is his first attack since starting allopurinol. Pain is sharp and stabbing. Worse with walking.  2.  Right thumb pain:  Present for several months.  Works Veterinary surgeonflipping houses/contracting/construction.  Also right handed.  Pain when moving thumb or wielding a hammer.  Also when using his phone.  Has not tried anything for relief.  Saw hand surgeon who recommended surgical repair.  He would like to try something else first.    3.  Chronic back pain:  Long-term issue for patient.  Has occasional narcotics when he has busy building season.  Late summer (now) is that season.  Hasn't needed a refill since this time last year.  Pain is moderate aching pain in low back after full day of work.  No paresthesias or radiation to legs.  No weakness or numbness.  NO bladder/bowel incontinence.    ROS as above.    PMH reviewed. Patient is a nonsmoker.   Past Medical History:  Diagnosis Date  . Allergy   . Chronic kidney disease    kidney stones  . Substance abuse    Past Surgical History:  Procedure Laterality Date  . KIDNEY STONE SURGERY      Medications reviewed. Current Outpatient Prescriptions  Medication Sig Dispense Refill  . allopurinol (ZYLOPRIM) 300 MG tablet Take one tablet daily 30 tablet 11  . citalopram (CELEXA) 40 MG tablet Take 1 tablet (40 mg total) by mouth daily. Take 20 mg daily for the first week, and then 40 after. 30 tablet 11  . meloxicam (MOBIC) 15 MG tablet TAKE 1 TABLET(15 MG) BY MOUTH DAILY 30 tablet 0  . sildenafil (VIAGRA) 100 MG tablet Take 0.5-1 tablets (50-100 mg total) by mouth daily as needed for erectile dysfunction.  5 tablet 11   No current facility-administered medications for this visit.      Physical Exam:  BP 134/86 (BP Location: Right Arm, Patient Position: Sitting, Cuff Size: Normal)   Pulse 75   Temp 97.8 F (36.6 C)   Resp 16   Ht 5\' 9"  (1.753 m)   Wt 219 lb (99.3 kg)   SpO2 96%   BMI 32.34 kg/m  Gen:  Alert, cooperative patient who appears stated age in no acute distress.  Vital signs reviewed. HEENT: EOMI,  MMM Back:  Normal skin, Spine with normal alignment and no deformity.  No tenderness to vertebral process palpation.  Paraspinous muscles are tender BL lumbar region without spasm.    Neuro:  Sensation and motor function 5/5 bilateral lower extremities.  Patellar and Achilles  DTR's +2 patellar BL.  He is able to walk on his heels and toes without difficulty.  Ext:  No LE edema MSK: Diminished extension and abduction of Right thumb secondary to pain.  TTP directly over extensor pollicus.   Feet:  Right foot WNL.  Left great toe with podagra.  Red and swollen.  Very tender to touch at 1st MCP joint.   Psych: Conversant and pleasant.  Not depressed, anxious, or manic appearing.  Linear and non-tangential thought process noted    Assessment and Plan:  1.  Gout: - current flare.   - treat with colchicine.  Norco will help with pain relief as well.  - check uric acid and restart allopurinol based on level.  May need to increase dosage.   2.  Right de Quervain's tenosynovitis:   - discussed various options. - he is most interested in sports medicine referral and possible injection - would like to hold off surgery as long as possible.  - On chronic mobic for low back pain.  Refilled this, which will help with this as well.  - checking creatinine.   3.  Back pain: - chronic.  - very infrequent opioid user - refill for norco for short term use to get him through end of summer - Mobic refill as well - continue exercises as he has been.   4.  Anxiety disorder: - states this is  well controlled on celexa.  No issues - celexa refilled today.

## 2016-07-25 NOTE — Patient Instructions (Addendum)
  Use the Norco for pain relief.   We are checking your uric acid levels.  We will adjust your allopurinol based on that level.    Use the colchicine for pain relief of your gout as well.   We'll let you know the lab results   It was good to see you today   IF you received an x-ray today, you will receive an invoice from [facility]. Please contact [facility] at [phone] with questions or concerns regarding your invoice.   IF you received labwork today, you will receive an invoice from [facility]. Please contact [facility] at [phone] with questions or concerns regarding your invoice.   Our billing staff will not be able to assist you with questions regarding bills from these companies.  You will be contacted with the lab results as soon as they are available. The fastest way to get your results is to activate your My Chart account. Instructions are located on the last page of this paperwork. If you have not heard from us regarding the results in 2 weeks, please contact this office.

## 2016-07-26 ENCOUNTER — Telehealth: Payer: Self-pay | Admitting: Family Medicine

## 2016-07-26 LAB — URIC ACID: URIC ACID, SERUM: 5.1 mg/dL (ref 4.0–8.0)

## 2016-07-26 LAB — CBC
HCT: 41.9 % (ref 38.5–50.0)
Hemoglobin: 14.3 g/dL (ref 13.2–17.1)
MCH: 27 pg (ref 27.0–33.0)
MCHC: 34.1 g/dL (ref 32.0–36.0)
MCV: 79.2 fL — AB (ref 80.0–100.0)
MPV: 9.5 fL (ref 7.5–12.5)
PLATELETS: 322 10*3/uL (ref 140–400)
RBC: 5.29 MIL/uL (ref 4.20–5.80)
RDW: 14.5 % (ref 11.0–15.0)
WBC: 7.1 10*3/uL (ref 3.8–10.8)

## 2016-07-26 LAB — COMPREHENSIVE METABOLIC PANEL
ALT: 17 U/L (ref 9–46)
AST: 19 U/L (ref 10–35)
Albumin: 4.3 g/dL (ref 3.6–5.1)
Alkaline Phosphatase: 51 U/L (ref 40–115)
BUN: 14 mg/dL (ref 7–25)
CHLORIDE: 104 mmol/L (ref 98–110)
CO2: 22 mmol/L (ref 20–31)
Calcium: 9.8 mg/dL (ref 8.6–10.3)
Creat: 0.92 mg/dL (ref 0.70–1.33)
Glucose, Bld: 85 mg/dL (ref 65–99)
POTASSIUM: 4.3 mmol/L (ref 3.5–5.3)
Sodium: 137 mmol/L (ref 135–146)
TOTAL PROTEIN: 7.3 g/dL (ref 6.1–8.1)
Total Bilirubin: 0.3 mg/dL (ref 0.2–1.2)

## 2016-07-26 NOTE — Telephone Encounter (Signed)
Called and discussed labs with patient. His uric acid was actually normal which is not uncommon to have acute gout attack. I do not see any reason to change his allopurinol since it has been 2 years without a gout attack. Otherwise his labs look good.  He will call to schedule appointment for cholesterol check sometime in the coming weeks. He would also like a physical exam at that point in the future as well.  Appreciative of call. He will await to hear from sports medicine

## 2016-08-06 ENCOUNTER — Encounter: Payer: Self-pay | Admitting: Family Medicine

## 2016-08-06 ENCOUNTER — Other Ambulatory Visit: Payer: Self-pay

## 2016-08-06 ENCOUNTER — Ambulatory Visit (INDEPENDENT_AMBULATORY_CARE_PROVIDER_SITE_OTHER): Payer: Self-pay | Admitting: Family Medicine

## 2016-08-06 VITALS — BP 149/86 | Ht 71.0 in | Wt 210.0 lb

## 2016-08-06 DIAGNOSIS — M79644 Pain in right finger(s): Secondary | ICD-10-CM

## 2016-08-06 MED ORDER — METHYLPREDNISOLONE ACETATE 40 MG/ML IJ SUSP
20.0000 mg | Freq: Once | INTRAMUSCULAR | Status: AC
  Administered 2016-08-06: 20 mg via INTRA_ARTICULAR

## 2016-08-07 DIAGNOSIS — M79644 Pain in right finger(s): Secondary | ICD-10-CM | POA: Insufficient documentation

## 2016-08-07 NOTE — Assessment & Plan Note (Signed)
Pain most likely associated with degenerative changes within the [facility]. This was injected today. Advised if he does not have any improvement then follow-up as we may need to pursue other sources of pain. - f/u PRN

## 2016-08-07 NOTE — Progress Notes (Signed)
  Richard Dawson - 60 y.o. male MRN [id number]  Date of birth: 11/09/1956  SUBJECTIVE:  Including CC & ROS.  Chief Complaint  Patient presents with  . Wrist Pain    [name] is a 60 year old male is presenting with right thumb pain. The pain has been going on for about one year. He has tried different oral medications which seemed to help but don't completely relieve his pain. His pain is exacerbated with his work. 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 The pain seems to get worse the more he uses it. If he rested and he has no pain. The pain is located on the base of his thumb on his right hand. The pain is localized with no radiation. He denies any prior surgery to that area.  ROS: No unexpected weight loss, fever, chills, swelling, instability, numbness/tingling, redness, otherwise see HPI    HISTORY: Past Medical, Surgical, Social, and Family History Reviewed & Updated per EMR.   Pertinent Historical Findings include: PMSHx -  nephrolithiasis PSHx -  No tobacco or alcohol use  Medications - norco  DATA REVIEWED: 12/10/13: right thumb x-ray: No acute fracture or subluxation. Mild degenerative changes first carpometacarpal joint.  PHYSICAL EXAM:  VS: BP:(!) 149/86  HR: bpm  TEMP: ( )  RESP:   HT:5\' 11"  (180.3 cm)   WT:210 lb (95.3 kg)  BMI:29.4 PHYSICAL EXAM: Gen: NAD, alert, cooperative with exam,  HEENT: clear conjunctiva, EOMI CV:  no edema, capillary refill brisk,  Resp: non-labored, normal speech Skin: no rashes, normal turgor  Neuro: no gross deficits.  Psych:  alert and oriented Right wrist:  No obvious effusion over the wrist. No overlying erythema or ecchymosis. Tenderness to palpation at the [facility]. No tenderness to palpation over the MCP joint. Normal wrist range of motion in all quadrants. Normal thumb abduction, opposition, extension, flexion. Normal pincer grasp. Positive grind test  Limited US: Right Thumb/Wrist: The MCP joint displayed no  degenerative changes or swelling. The [facility] joint showed some degenerative changes as well as hypoechoic changes consistent with effusion. Right thumb pain  Aspiration/Injection Procedure Note Richard Dawson 08/30/1956  Procedure: Injection Indications: Right CMC pain    Procedure Details Consent: Risks of procedure as well as the alternatives and risks of each were explained to the (patient/caregiver).  Consent for procedure obtained. Time Out: Verified patient identification, verified procedure, site/side was marked, verified correct patient position, special equipment/implants available, medications/allergies/relevent history reviewed, required imaging and test results available.  Performed.  The area was cleaned with iodine and alcohol swabs.    The right CMC was injected using 0.5 cc's of 40 mg Depomedrol and 1 cc's of 1% lidocaine with a 5/8" needle.  Ultrasound was used. A sterile dressing was applied.  Patient did tolerate procedure well.  ASSESSMENT & PLAN:   Pain of right thumb Pain most likely associated with degenerative changes within the [facility] joint. This was injected today. Advised if he does not have any improvement then follow-up as we may need to pursue other sources of pain. - f/u PRN

## 2016-08-08 ENCOUNTER — Other Ambulatory Visit: Payer: Self-pay | Admitting: Physician Assistant

## 2016-08-08 DIAGNOSIS — Z8739 Personal history of other diseases of the musculoskeletal system and connective tissue: Secondary | ICD-10-CM

## 2016-08-10 ENCOUNTER — Other Ambulatory Visit: Payer: Self-pay | Admitting: Physician Assistant

## 2016-08-10 DIAGNOSIS — Z8739 Personal history of other diseases of the musculoskeletal system and connective tissue: Secondary | ICD-10-CM

## 2016-09-20 ENCOUNTER — Ambulatory Visit (INDEPENDENT_AMBULATORY_CARE_PROVIDER_SITE_OTHER): Payer: Self-pay | Admitting: Family Medicine

## 2016-09-20 VITALS — BP 134/88 | HR 80 | Temp 97.5°F | Resp 18 | Ht 69.0 in | Wt 218.0 lb

## 2016-09-20 DIAGNOSIS — G8929 Other chronic pain: Secondary | ICD-10-CM

## 2016-09-20 DIAGNOSIS — M79644 Pain in right finger(s): Secondary | ICD-10-CM

## 2016-09-20 MED ORDER — MELOXICAM 15 MG PO TABS: ORAL_TABLET | ORAL | 0 refills | Status: DC

## 2016-09-20 MED ORDER — RANITIDINE HCL 150 MG PO TABS: 150.0000 mg | ORAL_TABLET | Freq: Two times a day (BID) | ORAL | 2 refills | Status: DC

## 2016-09-20 MED ORDER — RANITIDINE HCL 150 MG PO TABS: 150.0000 mg | ORAL_TABLET | Freq: Every day | ORAL | 2 refills | Status: AC

## 2016-09-20 MED ORDER — GABAPENTIN 100 MG PO CAPS: 200.0000 mg | ORAL_CAPSULE | Freq: Three times a day (TID) | ORAL | 1 refills | Status: AC | PRN

## 2016-09-20 NOTE — Progress Notes (Signed)
Patient ID: Richard Dawson, male    DOB: 06/10/1956, 60 y.o.   MRN: [id number]  PCP: Norberto SorensonSHAW,EVA, MD  Chief Complaint  Patient presents with  . Medication Refill    meloxicam and hydrocodone    Subjective:   HPI 60 year old male presents for evaluation of right thumb pain. [facility] and presents for reevaluation of chronic thumb pain. Reports that he has been evaluated by sports medicine and received two steriodal injection without relief of symptoms. Ultimately, he has been scheduled for surgery to remove the nerve from his right thumb to completely alleviate the pain. He presents today requesting pain medication up until his surgery in January. He reports that he uses a hand brace that he received from sports medicine that worked well.  He also reports that the Meloxicam and Hydrocodone-Acetominophen worked the best for pain management. Patient has a history of alcohol and cocaine addition referenced in the 04/01/13 notes by Dr. Cleta Albertsaub.  Social History   Social History  . Marital status: Single    Spouse name: N/A  . Number of children: N/A  . Years of education: N/A   Occupational History  . Not on file.   Social History Main Topics  . Smoking status: Never Smoker  . Smokeless tobacco: Never Used  . Alcohol use No  . Drug use: No  . Sexual activity: Yes    Birth control/ protection: None   Other Topics Concern  . Not on file   Social History Narrative  . No narrative on file    Family History  Problem Relation Age of Onset  . Hypertension Mother   . Cancer Father     Review of Systems See HPI   Patient Active Problem List   Diagnosis Date Noted  . Pain of right thumb 08/07/2016  . Allergy   . Substance abuse      Prior to Admission medications   Medication Sig Start Date End Date Taking? Authorizing Provider  allopurinol (ZYLOPRIM) 300 MG tablet TAKE 1 TABLET BY MOUTH DAILY 08/10/16  Yes Ofilia NeasMichael L Clark, PA-C  citalopram  (CELEXA) 40 MG tablet Take 1 tablet (40 mg total) by mouth daily. Take 20 mg daily for the first week, and then 40 after. 07/25/16  Yes Tobey GrimJeffrey H Walden, MD  colchicine 0.6 MG tablet Take 1 tablet (0.6 mg total) by mouth daily. 07/25/16  Yes Tobey GrimJeffrey H Walden, MD  HYDROcodone-acetaminophen (NORCO) 10-325 MG tablet Take 1 tablet by mouth every 8 (eight) hours as needed. 07/25/16  Yes Tobey GrimJeffrey H Walden, MD  HYDROcodone-acetaminophen (NORCO/VICODIN) 5-325 MG tablet TAKE ONE TABLET BY MOUTH FOUR TIMES A DAY AS NEEDED FOR PAIN 07/10/16  Yes Historical Provider, MD  meloxicam (MOBIC) 15 MG tablet TAKE 1 TABLET(15 MG) BY MOUTH DAILY 07/25/16  Yes Tobey GrimJeffrey H Walden, MD  sildenafil (VIAGRA) 100 MG tablet Take 0.5-1 tablets (50-100 mg total) by mouth daily as needed for erectile dysfunction. 09/07/15  Yes Ofilia NeasMichael L Clark, PA-C     No Known Allergies     Objective:  Physical Exam  Constitutional: He is oriented to person, place, and time. He appears well-developed and well-nourished.  HENT:  Head: Normocephalic and atraumatic.  Right Ear: External ear normal.  Left Ear: External ear normal.  Nose: Nose normal.  Eyes: Conjunctivae are normal. Pupils are equal, round, and reactive to light.  Neck: Normal range of motion. Neck supple.  Cardiovascular: Normal rate, regular rhythm, normal heart sounds  and intact distal pulses.   Pulmonary/Chest: Effort normal and breath sounds normal.  Musculoskeletal: He exhibits edema.  Right thumb generalized edema, warmth, bony tenderness with palpation. Decrease ROM of right thumb.  Neurological: He is alert and oriented to person, place, and time.  Skin: Skin is warm and dry.  Psychiatric: He has a normal mood and affect. His behavior is normal. Judgment and thought content normal.       Vitals:   09/20/16 0909  BP: 134/88  Pulse: 80  Resp: 18  Temp: 97.5 F (36.4 C)   Assessment & Plan:  1. Chronic pain of right thumb Start Ranitidine (Zantac) 150 mg once  daily. Take Gabapentin 200-300 mg up to 3 times daily as needed for pain.  Medication may cause drowsiness or sedation with initial doses. Take first dose at bedtime. Resume Meloxicam 15 mg once daily up until surgery.  Refused to refill opiates due to past history of substance abuse and medication is not indicated for neuropathic pain which is likely the underlying cause of the patients thumb pain.  Return for care as needed.  Godfrey PickKimberly S. Tiburcio PeaHarris, MSN, FNP-C [facility]

## 2016-09-20 NOTE — Patient Instructions (Addendum)
Start ranitidine (Zantac) 150 mg once daily. Take Gabapentin 200-300 mg up to 3 times daily as needed for pain. Medication may cause drowsiness or sedation with initial doses. Take first dose at bedtime. Resume Meloxicam 15 mg once daily up until surgery. Return for follow-up as needed.   IF you received an x-ray today, you will receive an invoice from [facility]. Please contact [facility] at [phone] with questions or concerns regarding your invoice.   IF you received labwork today, you will receive an invoice from [facility]. Please contact [facility] at [phone] with questions or concerns regarding your invoice.   Our billing staff will not be able to assist you with questions regarding bills from these companies.  You will be contacted with the lab results as soon as they are available. The fastest way to get your results is to activate your My Chart account. Instructions are located on the last page of this paperwork. If you have not heard from us regarding the results in 2 weeks, please contact this office.

## 2016-12-16 ENCOUNTER — Other Ambulatory Visit: Payer: Self-pay | Admitting: Family Medicine

## 2016-12-16 NOTE — Telephone Encounter (Signed)
Left message on VM.  Per last note, meloxicam prescribed until he was to have surgery this month. Request the patient call back with an update of his status, so that determination of appropriateness of refill can be made.

## 2016-12-18 NOTE — Telephone Encounter (Signed)
Called number listed and did not go through - 2 attempts.

## 2017-01-07 ENCOUNTER — Other Ambulatory Visit: Payer: Self-pay | Admitting: Physician Assistant

## 2017-01-07 DIAGNOSIS — Z8739 Personal history of other diseases of the musculoskeletal system and connective tissue: Secondary | ICD-10-CM

## 2017-03-04 ENCOUNTER — Other Ambulatory Visit: Payer: Self-pay | Admitting: Physician Assistant

## 2017-03-04 DIAGNOSIS — Z8739 Personal history of other diseases of the musculoskeletal system and connective tissue: Secondary | ICD-10-CM

## 2017-03-07 NOTE — Telephone Encounter (Signed)
09/2016 last ov 

## 2017-03-07 NOTE — Telephone Encounter (Signed)
Pt is overdue to have his cholesterol checked and a full physical. Please call and see if he would like to schedule for that (with anyone, I have not seen pt in many years, last provider that he saw that is here regularly is Chestine Spore who he saw sev times so either him or myself or whatever works. Thanks).

## 2017-09-05 ENCOUNTER — Other Ambulatory Visit: Payer: Self-pay | Admitting: Family Medicine

## 2017-09-05 DIAGNOSIS — Z8739 Personal history of other diseases of the musculoskeletal system and connective tissue: Secondary | ICD-10-CM

## 2018-02-25 ENCOUNTER — Encounter: Payer: Self-pay | Admitting: Physician Assistant
